# Patient Record
Sex: Female | Born: 1975 | Race: White | Hispanic: No | Marital: Married | Smoking: Never smoker
Health system: Southern US, Community
[De-identification: ages and names within clinical notes are randomized; demographics above are authoritative.]

## PROBLEM LIST (undated history)

## (undated) DIAGNOSIS — S98139A Complete traumatic amputation of one unspecified lesser toe, initial encounter: Secondary | ICD-10-CM

## (undated) HISTORY — PX: BUNIONECTOMY: SHX129

## (undated) HISTORY — DX: Complete traumatic amputation of one unspecified lesser toe, initial encounter: S98.139A

## (undated) HISTORY — PX: KNEE ARTHROSCOPY W/ ACL RECONSTRUCTION: SHX1858

---

## 2000-03-09 ENCOUNTER — Other Ambulatory Visit: Admission: RE | Admit: 2000-03-09 | Discharge: 2000-03-09 | Payer: Self-pay | Admitting: Obstetrics and Gynecology

## 2001-03-20 ENCOUNTER — Other Ambulatory Visit: Admission: RE | Admit: 2001-03-20 | Discharge: 2001-03-20 | Payer: Self-pay | Admitting: Obstetrics and Gynecology

## 2002-04-24 ENCOUNTER — Other Ambulatory Visit: Admission: RE | Admit: 2002-04-24 | Discharge: 2002-04-24 | Payer: Self-pay | Admitting: Obstetrics and Gynecology

## 2003-03-28 ENCOUNTER — Other Ambulatory Visit: Admission: RE | Admit: 2003-03-28 | Discharge: 2003-03-28 | Payer: Self-pay | Admitting: Obstetrics and Gynecology

## 2004-05-05 ENCOUNTER — Other Ambulatory Visit: Admission: RE | Admit: 2004-05-05 | Discharge: 2004-05-05 | Payer: Self-pay | Admitting: Obstetrics and Gynecology

## 2005-03-11 ENCOUNTER — Ambulatory Visit (HOSPITAL_COMMUNITY): Admission: RE | Admit: 2005-03-11 | Discharge: 2005-03-11 | Payer: Self-pay | Admitting: Obstetrics and Gynecology

## 2005-03-26 ENCOUNTER — Inpatient Hospital Stay (HOSPITAL_COMMUNITY): Admission: AD | Admit: 2005-03-26 | Discharge: 2005-03-28 | Payer: Self-pay | Admitting: Obstetrics and Gynecology

## 2005-05-07 ENCOUNTER — Other Ambulatory Visit: Admission: RE | Admit: 2005-05-07 | Discharge: 2005-05-07 | Payer: Self-pay | Admitting: Obstetrics and Gynecology

## 2006-04-15 IMAGING — US US OB COMP +14 WK
1 series · 13 of 28 positions shown · non-contrast
Comparison: none

CLINICAL DATA: Size less than dates.  Assess estimated fetal weight and amniotic fluid volume.

[Series 1: us ob comp +14 wk · 0.31mm/px · 13 of 41 slices shown]
[im 2/41]
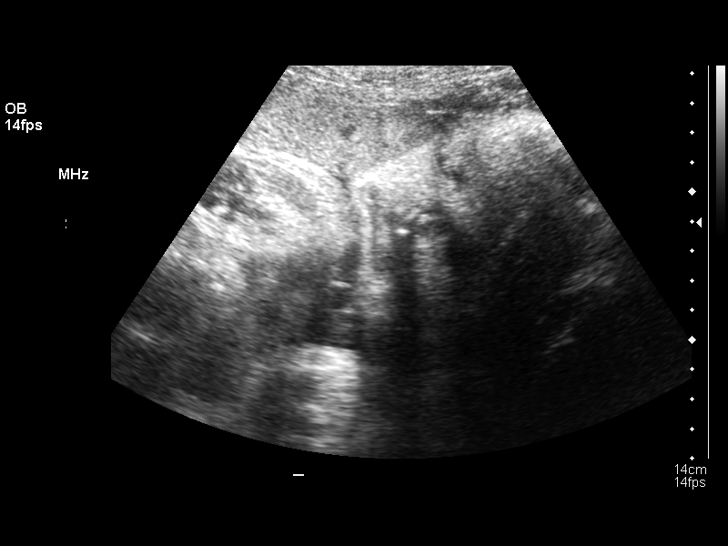
[im 5/41]
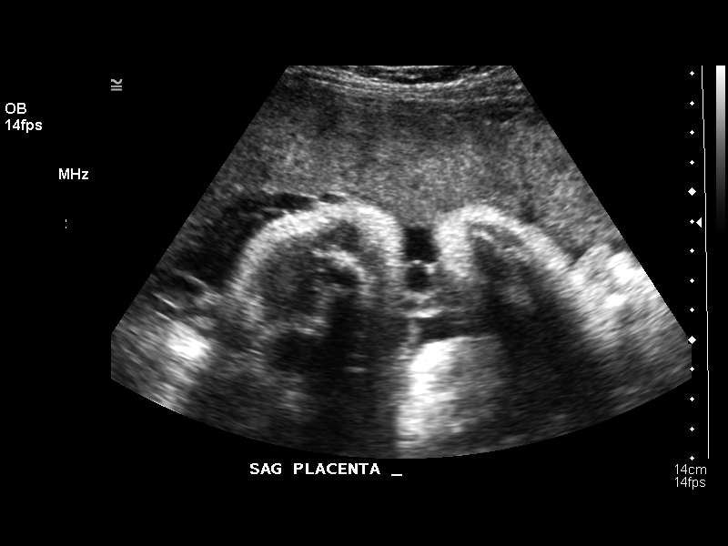
[im 8/41]
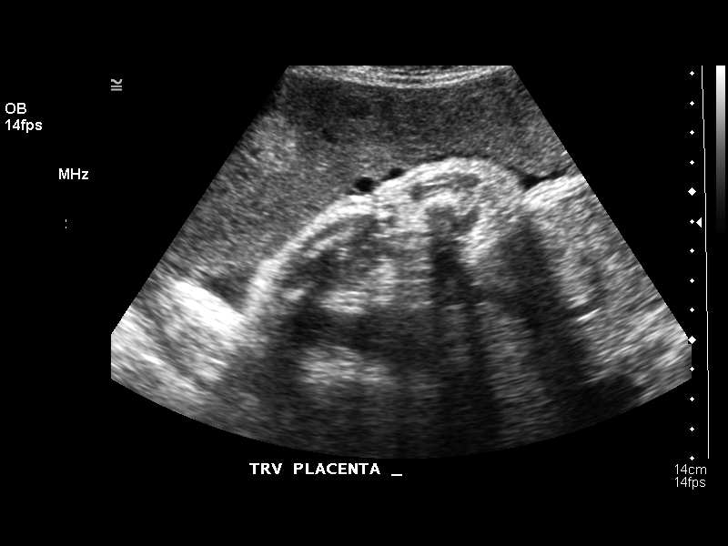
[im 11/41]
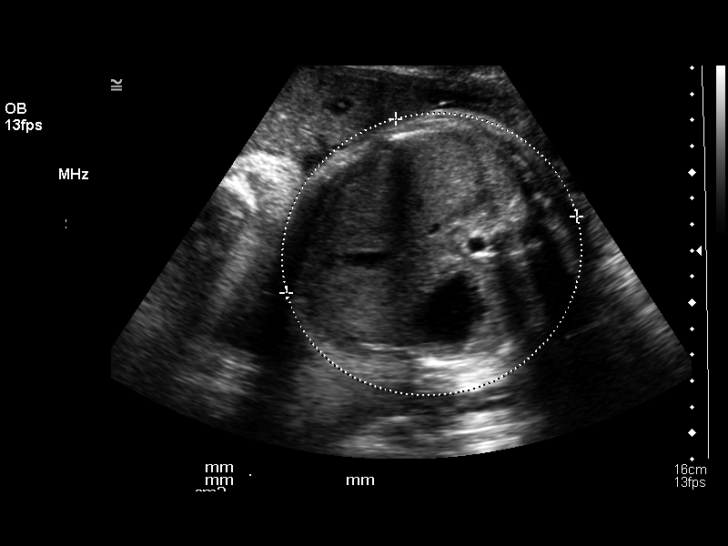
[im 14/41]
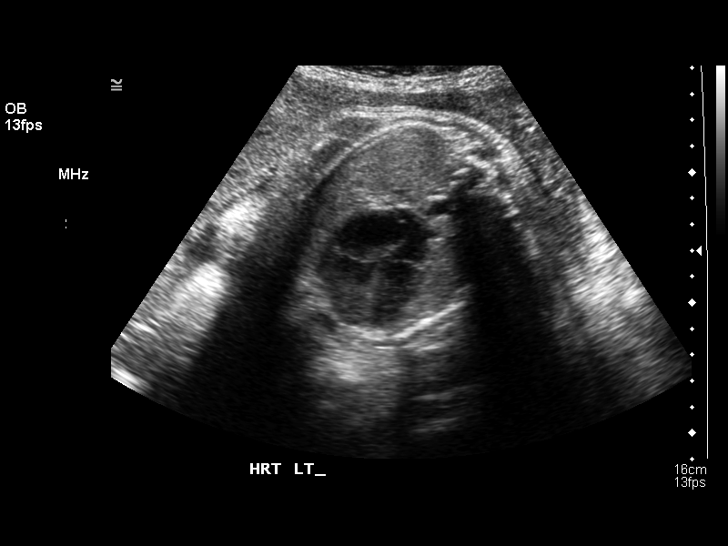
[im 17/41]
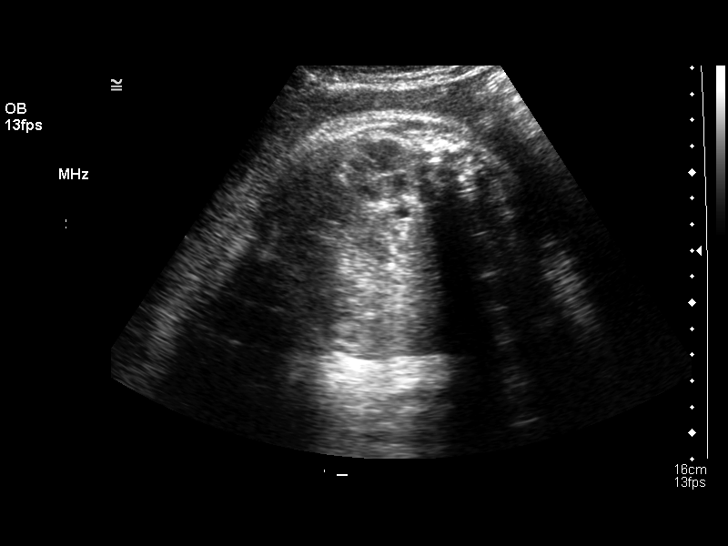
[im 21/41]
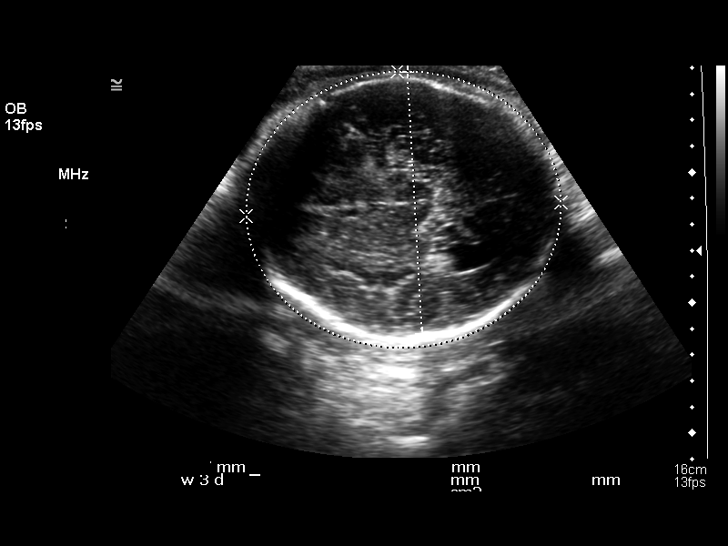
[im 24/41]
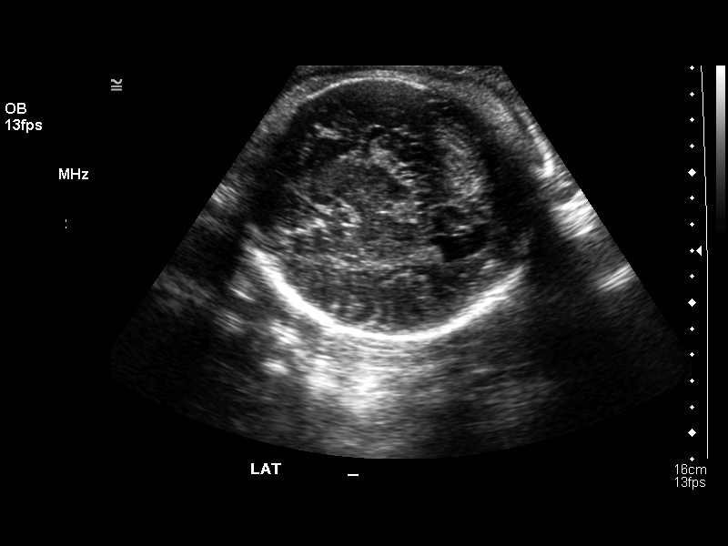
[im 27/41]
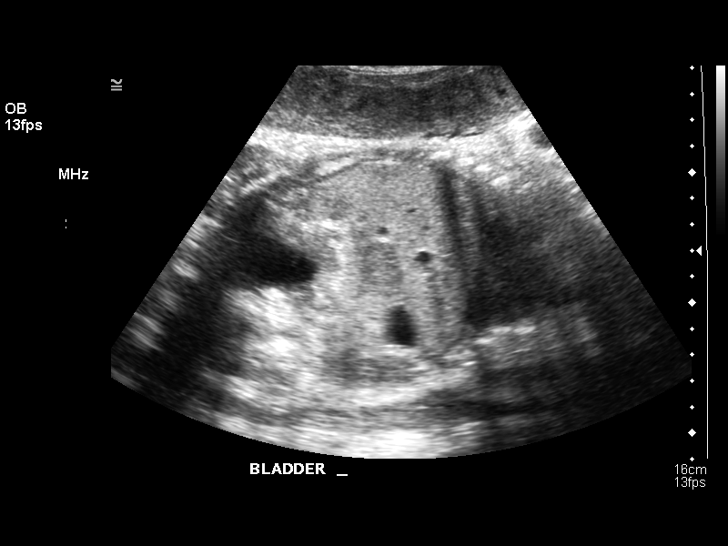
[im 30/41]
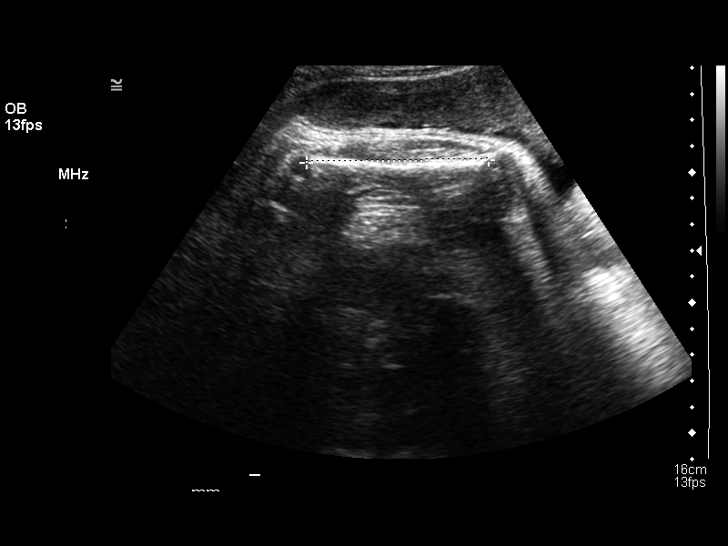
[im 33/41]
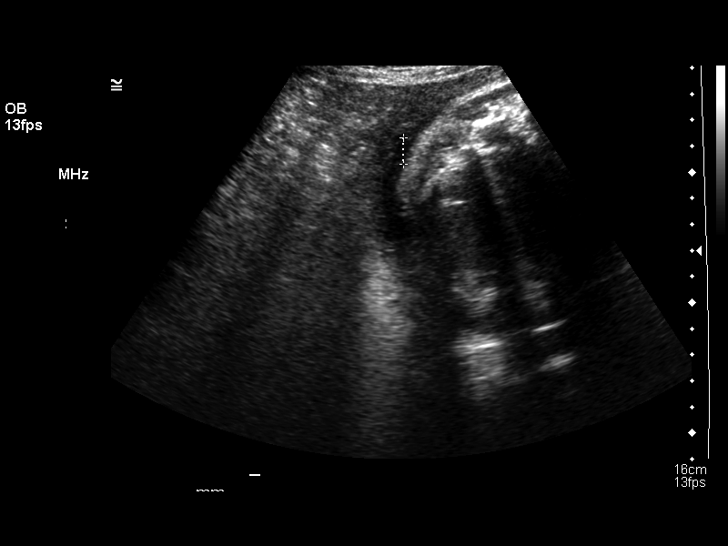
[im 36/41]
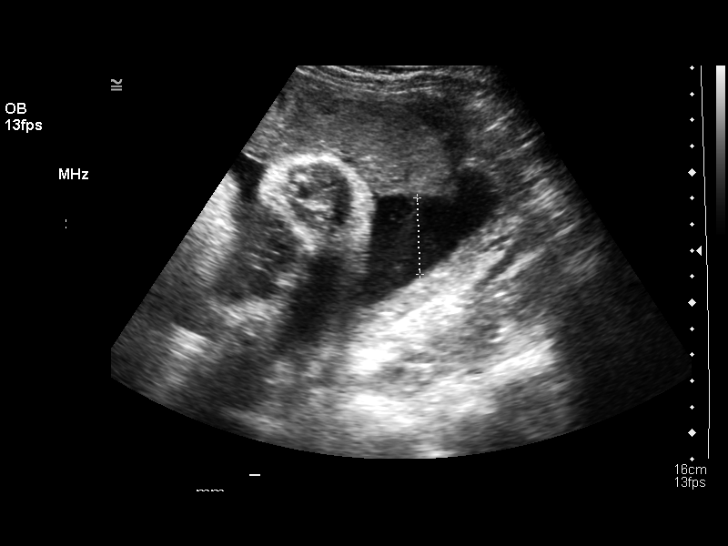
[im 39/41]
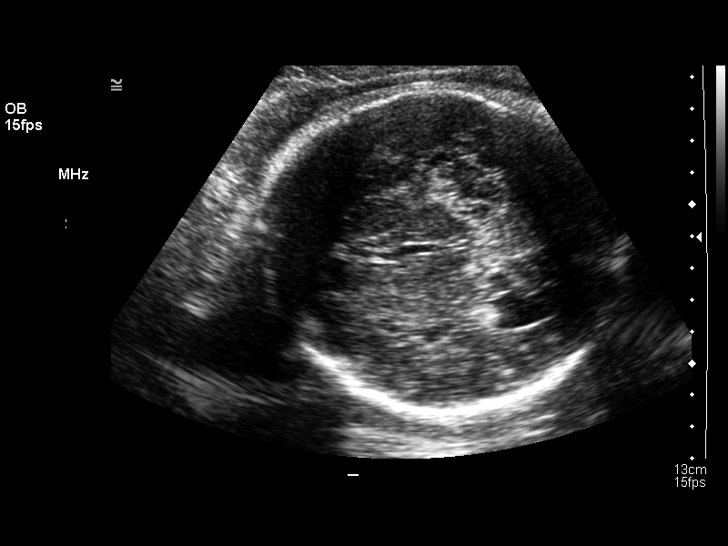

[13 of 28 positions shown; findings below may reference images not displayed]

OBSTETRICAL ULTRASOUND:
Number of Fetuses:  1
Heart Rate:  150
Movement:  Yes
Breathing:  Yes  
Presentation:  Cephalic
Placental Location:  Anterior
Grade:  I
Previa:  No
Amniotic Fluid (Subjective):  Normal
Amniotic Fluid (Objective):   10.7 cm AFI (5th -95th%ile = 7.3 ? 23.9 cm for 38 wks)

FETAL BIOMETRY
BPD:  10.2 cm   41 w 6 d
HC:  36.3 cm   Off the chart
AC:  34.5 cm   38 w 3 d
FL:    7.3 cm   37 w 3 d

MEAN GA:  39 w 3 d
1st US GA:  38 w 1 d
BPD/OFD: .85 (0.70 ? 0.86); FL/BPD: .72 (0.71 ? 0.87); FL/AC: .21 (0.20 ? 0.24); HC/AC: 1.05 (0.92 ? 1.05)
EFW:  4847 + / - 363 g (H) 75th ? 90th%ile (4715 to 9559 g) For 38 wks

FETAL ANATOMY
Lateral Ventricles:    Visualized 
Thalami/CSP:      Visualized 
Posterior Fossa:  Not visualized 
Nuchal Region:    N/A
Spine:      Not visualized 
4 Chamber Heart on Left:      Visualized 
Stomach on Left:      Visualized 
3 Vessel Cord:    Visualized 
Cord Insertion site:    Not visualized 
Kidneys:  Visualized 
Bladder:  Visualized 
Extremities:      Not visualized 

Comments:  Noted is the presence of borderline left lateral ventriculomegaly with the ventricle measuring 10.5 mm in AP width.  

MATERNAL UTERINE AND ADNEXAL FINDINGS
Cervix:   Not evaluated
IMPRESSION: 1.  Single intrauterine pregnancy demonstrating an estimated gestational age by ultrasound of 39 weeks and 2 days.  This is 1 week and 1 day ahead of assigned gestational age by initial ultrasound of 38 weeks and 1 day.  The estimated fetal weight is between the 75th and 90th percentile for a 38 week gestation and this is influenced by the large head measurement with the BPD correlating with a 41 week 6 day gestation and the head circumference off of the scale.  
2.  Subjectively and quantitatively normal amniotic fluid volume.
3.  A limited evaluation of fetal anatomic structures revealed borderline left lateral ventricular enlargement with an AP width of the lateral ventricle measuring 10.5 mm.  The right lateral ventricle could not be assessed due to positioning.  The thalami and cavum septum pellucidum have a normal appearance and no obvious intracranial abnormalities otherwise were noted.  A four chamber heart view, stomach, kidneys and bladder all have a normal appearance.  The remaining fetal anatomy was not evaluated.  Prior anatomic evaluations have been performed in the office.

## 2007-05-23 ENCOUNTER — Ambulatory Visit (HOSPITAL_COMMUNITY): Admission: RE | Admit: 2007-05-23 | Discharge: 2007-05-23 | Payer: Self-pay | Admitting: Internal Medicine

## 2007-06-27 ENCOUNTER — Inpatient Hospital Stay (HOSPITAL_COMMUNITY): Admission: RE | Admit: 2007-06-27 | Discharge: 2007-06-29 | Payer: Self-pay | Admitting: Obstetrics and Gynecology

## 2010-06-01 ENCOUNTER — Inpatient Hospital Stay (HOSPITAL_COMMUNITY): Admission: RE | Admit: 2010-06-01 | Discharge: 2010-06-03 | Payer: Self-pay | Admitting: Obstetrics and Gynecology

## 2010-10-04 ENCOUNTER — Encounter: Payer: Self-pay | Admitting: Obstetrics and Gynecology

## 2010-11-26 LAB — CBC
Hemoglobin: 12.9 g/dL (ref 12.0–15.0)
MCH: 32.5 pg (ref 26.0–34.0)
MCV: 92.9 fL (ref 78.0–100.0)
MCV: 93.5 fL (ref 78.0–100.0)
Platelets: 174 10*3/uL (ref 150–400)
Platelets: 212 10*3/uL (ref 150–400)
WBC: 9 10*3/uL (ref 4.0–10.5)

## 2010-11-26 LAB — RPR: RPR Ser Ql: NONREACTIVE

## 2011-01-29 NOTE — Discharge Summary (Signed)
[name], [name]                   ACCOUNT NO.:  [id number]   MEDICAL RECORD NO.:  [id number]          PATIENT TYPE:  INP   LOCATION:  9121                          FACILITY:  [facility]   PHYSICIAN:  [name], M.D. DATE OF BIRTH:  11/19/1975   DATE OF ADMISSION:  06/27/2007  DATE OF DISCHARGE:  06/29/2007                               DISCHARGE SUMMARY   DISCHARGE DIAGNOSES:  1. Term term pregnancy at 40 plus weeks delivered.  2. Status post normal spontaneous vaginal delivery.   DISCHARGE MEDICATIONS:  Motrin 600 mg p.o. every 6 hours.   DISCHARGE FOLLOWUP:  The patient's follow-up in 6 weeks for routine  postpartum exam.   HOSPITAL COURSE:  The patient is 35 year old G3, P 1-0-1-1 who was  admitted at [redacted] weeks gestation for induction of labor given post due  dates and favorable cervix.  Prenatal care was complicated by fundal  lag, but ultrasound showed an estimated fetal weight of 24th percentile  with normal fluid.  She had been followed with NSTs two times weekly,  and there were no other issues noted.  Prenatal labs were as follows, O+  antibody negative, rubella immune, hepatitis B surface antigen negative,  RPR negative, HIV negative, GC negative, chlamydia negative, group B  strep negative, 1-hour Glucola 106.   PAST OBSTETRICAL HISTORY:  In July 2006, she had a vaginal delivery of a  7 pounds 11 ounces infant.  She had one spontaneous miscarriage in 2007.   PAST GYNECOLOGICAL HISTORY:  No abnormal Pap smears.   PAST MEDICAL HISTORY:  1. Childhood asthma.  2. Irritable bowel syndrome which was stable.   PAST SURGICAL HISTORY:  1. She did lose three toes on her right foot as resolved of a      lawnmower accident as child.  2. She had a bunionectomy in 1996.  3. ACL repair and 1998.   MEDICATIONS:  Prenatal vitamins only.   ALLERGIES:  TYLOX, PENICILLIN, VICODIN.   PHYSICAL EXAMINATION:  VITAL SIGNS:  On admission she was afebrile and  stable vital signs  were noted.  Fetal heart rate was reactive.  Cervix  was 52, 3 and -2 station.  She had rupture of membranes performed with  clear fluid noted.  She continued to progress on Pitocin and reached  complete dilation after receiving an epidural.  She pushed well with a  normal spontaneous vaginal delivery of a vigorous female infant over a  second-degree laceration.  Apgars were 9 and 9, weight was 6 pounds 8  ounces.  Placenta delivered spontaneously.  Cervix and rectum were  intact.  She did have a small second-degree laceration which was  repaired with 2-0 Vicryl.  Postpartum course was unremarkable.  She had  a discharge hemoglobin of 10.9 and tolerated her pain well with Motrin  and was felt stable for discharge on postpartum day #2.      [name], M.D.  Electronically Signed     KR/MEDQ  D:  07/26/2007  T:  07/27/2007  Job:  [id number]

## 2011-01-29 NOTE — Discharge Summary (Signed)
NAMEINGRI, [name]                   ACCOUNT NO.:  [id number]   MEDICAL RECORD NO.:  [id number]          PATIENT TYPE:  INP   LOCATION:  9115                          FACILITY:  [facility]   PHYSICIAN:  Huel Cote, M.D. DATE OF BIRTH:  1976/02/12   DATE OF ADMISSION:  03/26/2005  DATE OF DISCHARGE:                                 DISCHARGE SUMMARY   DISCHARGE DIAGNOSES:  1.  Term pregnancy at 40+ weeks, delivered.  2.  Status post normal spontaneous vaginal delivery.   DISCHARGE MEDICATIONS:  1.  Motrin 600 mg p.o. q.6h.  2.  The patient was given a prescription for Percocet one to two tablets      p.o. every 4 hours as needed which she may or may not fill.   DISCHARGE FOLLOW-UP:  The patient is to follow up in 6 weeks for her routine  postpartum exam.   HOSPITAL COURSE:  The patient is a 35 year old G1 P0 who was at 24 and two-  sevenths weeks admitted for induction of labor given post due date and a  favorable cervix. Prenatal care was unremarkable. She had had a slight  fundal height lag; however, ultrasound confirmed normal EFW and AFI and  there was a family history of Werdnig-Hoffmann disease but the patient  declined any genetic workup and had no other complications. Prenatal labs  were as follows:  O positive, antibody negative, RPR nonreactive, rubella  immune, hepatitis B surface antigen negative, HIV negative, GC negative,  chlamydia negative, GBS negative, triple screen normal, 1-hour Glucola 94.  She had no past obstetrical history. No gynecological history. Her past  medical history significant for irritable bowel syndrome. Past surgical  history:  She did have a right foot injury as child where she lost three  toes with a lawnmower incident. She had a left bunionectomy and an ACL  repair in 1998. She had no allergies, no medications. On admission she was  afebrile with stable vital signs. Fetal heart rate was reactive. She was 2,  70, and a -1 and was placed on  Pitocin. She progressed quickly and within 4  hours received epidural and reached complete dilation, pushed well, had a  normal spontaneous vaginal delivery of a vigorous female infant over a second-  degree laceration. Apgars were 9 and 9, weight was 7 pounds 11 ounces. The  placenta delivered spontaneously. She had a second-degree laceration which  was repaired with 2-0 Vicryl and the sphincter was reinforced. Estimated  blood loss was 350. She was then admitted for routine postpartum care and on  postpartum day #2 is doing very well. Her pain was well controlled. She was  voiding and having bowel movements without difficulty, and was felt stable  for discharge home.       KR/MEDQ  D:  03/28/2005  T:  03/28/2005  Job:  [id number]

## 2011-06-23 LAB — CBC
HCT: 31 — ABNORMAL LOW
MCV: 92.1

## 2011-06-24 LAB — CBC
HCT: 32.3 — ABNORMAL LOW
Hemoglobin: 11.6 — ABNORMAL LOW
MCHC: 36
MCV: 89.8
Platelets: 234
RBC: 3.59 — ABNORMAL LOW

## 2013-08-30 ENCOUNTER — Encounter (INDEPENDENT_AMBULATORY_CARE_PROVIDER_SITE_OTHER): Payer: Self-pay

## 2013-08-30 ENCOUNTER — Ambulatory Visit (INDEPENDENT_AMBULATORY_CARE_PROVIDER_SITE_OTHER): Payer: BC Managed Care – PPO | Admitting: Family Medicine

## 2013-08-30 ENCOUNTER — Encounter: Payer: Self-pay | Admitting: Family Medicine

## 2013-08-30 VITALS — BP 129/88 | HR 73 | Ht 62.0 in | Wt 128.0 lb

## 2013-08-30 DIAGNOSIS — M25461 Effusion, right knee: Secondary | ICD-10-CM

## 2013-08-30 DIAGNOSIS — M25469 Effusion, unspecified knee: Secondary | ICD-10-CM

## 2013-08-30 NOTE — Patient Instructions (Signed)
You have a knee effusion. Without pain, redness I wouldn't recommend aspirating and/or injecting your knee at this time. Rheumatoid arthritis, gout are in the differential but unlikely without pain or other joints involved. Your knee structurally is sound otherwise. 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 Straight leg raises, leg raises with foot turned outwards, hip side raises 3 sets of 10 once a day for next 6 weeks. Wear knee brace when running, exercising. Activities as tolerated without restrictions. Consider icing, elevation as needed for swelling. If you continue to have problems and/or pain comes on we could consider aspirating and sending the fluid to rule out those other conditions. Follow up with me as needed.

## 2013-09-02 ENCOUNTER — Encounter: Payer: Self-pay | Admitting: Family Medicine

## 2013-09-02 NOTE — Progress Notes (Signed)
Patient ID: [name], female   DOB: [birth year], 37 y.o.   MRN: [id number]  PCP: No primary provider on file.  Subjective:   HPI: Patient is a 37 y.o. female here for right knee swelling.  Patient denies known injury. She runs about 3-4 days a week, total 15 miles a week. States she had swelling of her knee for about the past week with feeling of instability. She had similar issues after having each of her kids but it went away on its own. No other joints involved. No personal history of gout. No family history of RA. Knees make a crunching sound when doing squats. No locking or catching.  History reviewed. No pertinent past medical history.  No current outpatient prescriptions on file prior to visit.   No current facility-administered medications on file prior to visit.    Past Surgical History  Procedure Laterality Date  . Knee arthroscopy w/ acl reconstruction Left     Allergies  Allergen Reactions  . Hydrocodone   . Penicillins   . Tylox [Oxycodone-Acetaminophen]     History   Social History  . Marital Status: Married    Spouse Name: N/A    Number of Children: N/A  . Years of Education: N/A   Occupational History  . Not on file.   Social History Main Topics  . Smoking status: Never Smoker   . Smokeless tobacco: Not on file  . Alcohol Use: Not on file  . Drug Use: Not on file  . Sexual Activity: Not on file   Other Topics Concern  . Not on file   Social History Narrative  . No narrative on file    Family History  Problem Relation Age of Onset  . Diabetes Mother   . Diabetes Father   . Heart attack Father   . Hyperlipidemia Father   . Hypertension Father   . Sudden death Father     BP 129/88  Pulse 73  Ht 5\' 2"  (1.575 m)  Wt 128 lb (58.06 kg)  BMI 23.41 kg/m2  Review of Systems: See HPI above.    Objective:  Physical Exam:  Gen: NAD  Right knee: Mild effusion.  No gross deformity, ecchymoses. No TTP. FROM. Negative ant/post  drawers. Negative valgus/varus testing. Negative lachmanns. Negative mcmurrays, apleys, patellar apprehension. NV intact distally.    Assessment & Plan:  1. Right knee effusion - small effusion currently and otherwise asymptomatic.  Has been told in past was related to patellar tracking issues.  No personal h/o gout, no FH rheumatoid arthritis.  Doubt either of these given patient's asymptomatic status.  Discussed possibility of aspirating and analyzing fluid but with her being asymptomatic I would wait on this unless she gets other joint involvement, more symptoms.  Discussed basic patellofemoral exercises, measures.  Icing, elevation, compression as needed.

## 2013-09-04 DIAGNOSIS — M25461 Effusion, right knee: Secondary | ICD-10-CM | POA: Insufficient documentation

## 2013-09-04 NOTE — Assessment & Plan Note (Signed)
small effusion currently and otherwise asymptomatic.  Has been told in past was related to patellar tracking issues.  No personal h/o gout, no FH rheumatoid arthritis.  Doubt either of these given patient's asymptomatic status.  Discussed possibility of aspirating and analyzing fluid but with her being asymptomatic I would wait on this unless she gets other joint involvement, more symptoms.  Discussed basic patellofemoral exercises, measures.  Icing, elevation, compression as needed.

## 2013-09-27 ENCOUNTER — Ambulatory Visit (INDEPENDENT_AMBULATORY_CARE_PROVIDER_SITE_OTHER): Payer: BC Managed Care – PPO | Admitting: Family Medicine

## 2013-09-27 ENCOUNTER — Encounter: Payer: Self-pay | Admitting: Family Medicine

## 2013-09-27 VITALS — BP 134/83 | HR 63 | Resp 16 | Ht 62.5 in | Wt 132.0 lb

## 2013-09-27 DIAGNOSIS — R0602 Shortness of breath: Secondary | ICD-10-CM

## 2013-09-27 DIAGNOSIS — R5383 Other fatigue: Secondary | ICD-10-CM

## 2013-09-27 DIAGNOSIS — R5381 Other malaise: Secondary | ICD-10-CM

## 2013-09-27 DIAGNOSIS — Z13220 Encounter for screening for lipoid disorders: Secondary | ICD-10-CM

## 2013-09-27 NOTE — Patient Instructions (Signed)
1)  BP - Check no more than 3 x per week  DASH Diet The DASH diet stands for "Dietary Approaches to Stop Hypertension." It is a healthy eating plan that has been shown to reduce high blood pressure (hypertension) in as little as 14 days, while also possibly providing other significant health benefits. These other health benefits include reducing the risk of breast cancer after menopause and reducing the risk of type 2 diabetes, heart disease, colon cancer, and stroke. Health benefits also include weight loss and slowing kidney failure in patients with chronic kidney disease.  DIET GUIDELINES  Limit salt (sodium). Your diet should contain less than 1500 mg of sodium daily.  Limit refined or processed carbohydrates. Your diet should include mostly whole grains. Desserts and added sugars should be used sparingly.  Include small amounts of heart-healthy fats. These types of fats include nuts, oils, and tub margarine. Limit saturated and trans fats. These fats have been shown to be harmful in the body. CHOOSING FOODS  The following food groups are based on a 2000 calorie diet. See your Registered Dietitian for individual calorie needs. Grains and Grain Products (6 to 8 servings daily)  Eat More Often: Whole-wheat bread, brown rice, whole-grain or wheat pasta, quinoa, popcorn without added fat or salt (air popped).  Eat Less Often: White bread, white pasta, white rice, cornbread. Vegetables (4 to 5 servings daily)  Eat More Often: Fresh, frozen, and canned vegetables. Vegetables may be raw, steamed, roasted, or grilled with a minimal amount of fat.  Eat Less Often/Avoid: Creamed or fried vegetables. Vegetables in a cheese sauce. Fruit (4 to 5 servings daily)  Eat More Often: All fresh, canned (in natural juice), or frozen fruits. Dried fruits without added sugar. One hundred percent fruit juice ( cup [237 mL] daily).  Eat Less Often: Dried fruits with added sugar. Canned fruit in light or heavy  syrup. Foot LockerLean Meats, Fish, and Poultry (2 servings or less daily. One serving is 3 to 4 oz [85-114 g]).  Eat More Often: Ninety percent or leaner ground beef, tenderloin, sirloin. Round cuts of beef, chicken breast, Malaw[place]urkey breast. All fish. Grill, bake, or broil your meat. Nothing should be fried.  Eat Less Often/Avoid: Fatty cuts of meat, Malaw[place]urkey, or chicken leg, thigh, or wing. Fried cuts of meat or fish. Dairy (2 to 3 servings)  Eat More Often: Low-fat or fat-free milk, low-fat plain or light yogurt, reduced-fat or part-skim cheese.  Eat Less Often/Avoid: Milk (whole, 2%).Whole milk yogurt. Full-fat cheeses. Nuts, Seeds, and Legumes (4 to 5 servings per week)  Eat More Often: All without added salt.  Eat Less Often/Avoid: Salted nuts and seeds, canned beans with added salt. Fats and Sweets (limited)  Eat More Often: Vegetable oils, tub margarines without trans fats, sugar-free gelatin. Mayonnaise and salad dressings.  Eat Less Often/Avoid: Coconut oils, palm oils, butter, stick margarine, cream, half and half, cookies, candy, pie. FOR MORE INFORMATION The Dash Diet Eating Plan: www.dashdiet.org Document Released: 08/19/2011 Document Revised: 11/22/2011 Document Reviewed: 08/19/2011 [facility] Patient Information 2014 [employer], [place].

## 2013-09-27 NOTE — Progress Notes (Signed)
Subjective:    Patient ID: [name], female    DOB: 11/21/1975, 38 y.o.   MRN: [id number]  HPI  [name] is here today to establish care with our practice.  She was referred to us by Dr. Lazaro ArmsHudnall's staff.  She does not currently have a PCP since she rarely gets sick.  She [facility]. She feels that she is in very good health.  Her only concern at today's visit is that she has recently been having some episodes of shortness of breath that she says lasts a couple of hours.  She feels that she has to work harder to expand her chest to get in a good breathe.     Review of Systems  Constitutional: Negative for activity change, fatigue and unexpected weight change.  HENT: Negative.   Eyes: Negative.   Respiratory: Positive for shortness of breath.   Cardiovascular: Negative for chest pain, palpitations and leg swelling.  Gastrointestinal: Negative for diarrhea and constipation.  Endocrine: Negative.   Genitourinary: Negative for difficulty urinating.  Musculoskeletal: Negative.   Skin: Negative.   Neurological: Negative.   Hematological: Negative for adenopathy. Does not bruise/bleed easily.  Psychiatric/Behavioral: Negative for sleep disturbance and dysphoric mood. The patient is not nervous/anxious.      Past Medical History  Diagnosis Date  . 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 Knee arthroscopy w/ acl reconstruction Left   . Bunionectomy       History   Social History Narrative   Marital Status:  Married Multimedia programmer(Eric)    Children: Son Alycia Rossetti(Ryan) Daughters ([place], [place])    Pets: Dogs (2)    Living Situation: Lives with husband and children.   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 Diabetes Mother     Type I   . Kidney disease Mother   . Fibrocystic breast disease Mother   . Diabetes Father     Type II  . Heart attack Father   . Hyperlipidemia Father   . Hypertension Father   . Sudden death Father   . Alcohol abuse Father   . Arthritis Father   . Stroke Father   . Cancer Paternal Grandmother     Breast, Pancreatic   . Diabetes Paternal Grandmother   . Heart disease Paternal Grandfather     Brothers x 4 all died from MI at an early age.    . Sudden death Paternal Grandfather      Allergies  Allergen Reactions  . Hydrocodone Hives  . Penicillins Hives  . Tylox [Oxycodone-Acetaminophen] Hives      Objective:   Physical Exam  Vitals reviewed. Constitutional: She is oriented to person, place, and time.  Eyes: Conjunctivae are normal. No scleral icterus.  Neck: Neck supple. No thyromegaly present.  Cardiovascular: Normal rate, regular rhythm and normal  heart sounds.   Pulmonary/Chest: Effort normal and breath sounds normal.  Musculoskeletal: She exhibits no edema and no tenderness.  Lymphadenopathy:    She has no cervical adenopathy.  Neurological: She is alert and oriented to person, place, and time.  Skin: Skin is warm and dry.  Psychiatric: She has a normal mood and affect. Her behavior is normal. Judgment and thought content normal.      Assessment & Plan:    [name] was seen today for establish care.  Diagnoses and associated orders for this visit:  Shortness of breath Comments: Her breathing appears to be perfectly fine today and she is not experiencing this sensation that she has had over the past few weeks.  We'll send for a CXR if these symptoms continue.    Other malaise and fatigue - CBC w/Diff - COMPLETE METABOLIC  PANEL WITH GFR - TSH  Need for lipid screening - Lipid panel  TIME SPENT "FACE TO FACE" WITH PATIENT -  30 MINS

## 2013-10-05 LAB — CBC WITH DIFFERENTIAL/PLATELET
Basophils Absolute: 0 10*3/uL (ref 0.0–0.1)
Basophils Relative: 0 % (ref 0–1)
Eosinophils Absolute: 0.1 10*3/uL (ref 0.0–0.7)
Eosinophils Relative: 2 % (ref 0–5)
HCT: 38.8 % (ref 36.0–46.0)
Hemoglobin: 13.5 g/dL (ref 12.0–15.0)
Lymphocytes Relative: 38 % (ref 12–46)
Lymphs Abs: 1.4 10*3/uL (ref 0.7–4.0)
MCH: 30.3 pg (ref 26.0–34.0)
MCHC: 34.8 g/dL (ref 30.0–36.0)
MCV: 87.2 fL (ref 78.0–100.0)
Monocytes Absolute: 0.2 10*3/uL (ref 0.1–1.0)
Monocytes Relative: 7 % (ref 3–12)
Neutro Abs: 1.9 10*3/uL (ref 1.7–7.7)
Neutrophils Relative %: 53 % (ref 43–77)
Platelets: 279 10*3/uL (ref 150–400)
RBC: 4.45 MIL/uL (ref 3.87–5.11)
RDW: 13.4 % (ref 11.5–15.5)
WBC: 3.6 10*3/uL — ABNORMAL LOW (ref 4.0–10.5)

## 2013-10-05 LAB — COMPLETE METABOLIC PANEL WITH GFR
ALT: 8 U/L (ref 0–35)
AST: 15 U/L (ref 0–37)
Albumin: 4.3 g/dL (ref 3.5–5.2)
Alkaline Phosphatase: 43 U/L (ref 39–117)
BUN: 13 mg/dL (ref 6–23)
CO2: 29 mEq/L (ref 19–32)
Calcium: 9.1 mg/dL (ref 8.4–10.5)
Chloride: 103 mEq/L (ref 96–112)
Creat: 0.81 mg/dL (ref 0.50–1.10)
GFR, Est African American: >89 mL/min
GFR, Est Non African American: >89 mL/min
Glucose, Bld: 90 mg/dL (ref 70–99)
Potassium: 3.9 mEq/L (ref 3.5–5.3)
Sodium: 139 mEq/L (ref 135–145)
Total Bilirubin: 0.4 mg/dL (ref 0.3–1.2)
Total Protein: 7 g/dL (ref 6.0–8.3)

## 2013-10-05 LAB — LIPID PANEL
Cholesterol: 157 mg/dL (ref 0–200)
HDL: 63 mg/dL (ref >39)
LDL Cholesterol: 83 mg/dL (ref 0–99)
Total CHOL/HDL Ratio: 2.5 Ratio
Triglycerides: 53 mg/dL (ref <150)
VLDL: 11 mg/dL (ref 0–40)

## 2013-10-06 LAB — TSH: TSH: 1.96 u[IU]/mL (ref 0.350–4.500)

## 2013-10-15 ENCOUNTER — Encounter: Payer: Self-pay | Admitting: *Deleted

## 2013-11-20 ENCOUNTER — Encounter: Payer: Self-pay | Admitting: Family Medicine

## 2013-11-20 ENCOUNTER — Other Ambulatory Visit (HOSPITAL_COMMUNITY)
Admission: RE | Admit: 2013-11-20 | Discharge: 2013-11-20 | Disposition: A | Discharge status: Home / Self Care | Payer: BC Managed Care – PPO | Source: Ambulatory Visit | Attending: Family Medicine | Admitting: Family Medicine

## 2013-11-20 ENCOUNTER — Ambulatory Visit (INDEPENDENT_AMBULATORY_CARE_PROVIDER_SITE_OTHER): Payer: BC Managed Care – PPO | Admitting: Family Medicine

## 2013-11-20 ENCOUNTER — Encounter (INDEPENDENT_AMBULATORY_CARE_PROVIDER_SITE_OTHER): Payer: Self-pay

## 2013-11-20 VITALS — BP 102/71 | HR 85 | Ht 62.5 in | Wt 133.0 lb

## 2013-11-20 DIAGNOSIS — Z0000 Encounter for general adult medical examination without abnormal findings: Secondary | ICD-10-CM

## 2013-11-20 DIAGNOSIS — R5383 Other fatigue: Secondary | ICD-10-CM

## 2013-11-20 DIAGNOSIS — Z13220 Encounter for screening for lipoid disorders: Secondary | ICD-10-CM | POA: Insufficient documentation

## 2013-11-20 DIAGNOSIS — R5381 Other malaise: Secondary | ICD-10-CM | POA: Insufficient documentation

## 2013-11-20 DIAGNOSIS — D72819 Decreased white blood cell count, unspecified: Secondary | ICD-10-CM

## 2013-11-20 DIAGNOSIS — Z01419 Encounter for gynecological examination (general) (routine) without abnormal findings: Secondary | ICD-10-CM | POA: Insufficient documentation

## 2013-11-20 DIAGNOSIS — Z124 Encounter for screening for malignant neoplasm of cervix: Secondary | ICD-10-CM

## 2013-11-20 DIAGNOSIS — R0602 Shortness of breath: Secondary | ICD-10-CM | POA: Insufficient documentation

## 2013-11-20 DIAGNOSIS — Z1151 Encounter for screening for human papillomavirus (HPV): Secondary | ICD-10-CM | POA: Insufficient documentation

## 2013-11-20 LAB — POCT URINALYSIS DIPSTICK
Bilirubin, UA: NEGATIVE
Blood, UA: NEGATIVE
Glucose, UA: NEGATIVE
Ketones, UA: NEGATIVE
Leukocytes, UA: NEGATIVE
Nitrite, UA: NEGATIVE
Protein, UA: NEGATIVE
Spec Grav, UA: <=1.005
Urobilinogen, UA: NEGATIVE
pH, UA: 6

## 2013-11-20 NOTE — Progress Notes (Deleted)
   Subjective:    Patient ID: [name], female    DOB: 11/09/1975, 38 y.o.   MRN: [id number]  HPI  [name] is here today for her annual CPE/PAP.  Her LMP was on 10/24/2013.  She has continued monitoring her blood pressure but she feels that it continues fluctuating.    Review of Systems     Objective:   Physical Exam  Musculoskeletal:       Feet:          Assessment & Plan:

## 2013-11-20 NOTE — Patient Instructions (Signed)

## 2013-12-31 NOTE — Progress Notes (Signed)
Subjective:    Patient ID: Era BumpersMary S Proctor, female    DOB: 04/07/1976, 38 y.o.   MRN: [id number]  HPI  [name] is here today for her annual CPE/PAP.  Her LMP was on 10/24/2013.  She has been monitoring her blood pressure and has noticed a lot of fluctuations in her level.       Review of Systems  Constitutional: Negative for activity change, appetite change, fatigue and unexpected weight change.  HENT: Negative for congestion, dental problem, ear pain, hearing loss, trouble swallowing and voice change.   Eyes: Negative for pain, redness and visual disturbance.  Respiratory: Negative for cough and shortness of breath.   Cardiovascular: Negative for chest pain, palpitations and leg swelling.  Gastrointestinal: Negative for nausea, vomiting, abdominal pain, diarrhea, constipation and blood in stool.  Endocrine: Negative for cold intolerance, heat intolerance, polydipsia, polyphagia and polyuria.  Genitourinary: Negative for dysuria, urgency, frequency, hematuria, vaginal discharge and pelvic pain.  Musculoskeletal: Negative for arthralgias, back pain, joint swelling, myalgias and neck pain.  Skin: Negative for rash.  Neurological: Negative for dizziness, weakness and headaches.  Hematological: Negative for adenopathy. Does not bruise/bleed easily.  Psychiatric/Behavioral: Negative for sleep disturbance, dysphoric mood and decreased concentration. The patient is not nervous/anxious.     Past Medical History  Diagnosis Date  . 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 Knee arthroscopy w/ acl reconstruction Left   . Bunionectomy       History   Social History Narrative   Marital Status:  Married Multimedia programmer(Eric)    Children: Son Alycia Rossetti(Ryan) Daughters ([place], [place])    Pets: Dogs (2)    Living Situation: Lives with husband and children.   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 Diabetes Mother     Type I   . Kidney disease Mother   . Fibrocystic breast disease Mother   . Diabetes Father     Type II  . Heart attack Father   . Hyperlipidemia Father   . Hypertension Father   . Sudden death Father   . Alcohol abuse Father   . Arthritis Father   . Stroke Father   . Cancer Paternal Grandmother     Breast, Pancreatic   . Diabetes Paternal Grandmother   . Heart disease Paternal Grandfather     Brothers x 4 all died from MI at an early age.    . Sudden death Paternal Grandfather      Allergies  Allergen Reactions  . Hydrocodone Hives  . Penicillins Hives  . Tylox [Oxycodone-Acetaminophen] Hives       Objective:   Physical Exam  Constitutional: She is oriented to person, place, and time. She appears well-developed and well-nourished.  HENT:  Head: Normocephalic and atraumatic.  Right Ear: External ear normal.  Left Ear: External ear normal.  Nose: Nose normal.  Mouth/Throat: Oropharynx is clear  and moist.  Eyes: Conjunctivae are normal. Pupils are equal, round, and reactive to light. No scleral icterus.  Neck: Normal range of motion. Neck supple. No thyromegaly present.  Cardiovascular: Normal rate, regular rhythm, normal heart sounds and intact distal pulses.  Exam reveals no gallop and no friction rub.   No murmur heard. Pulmonary/Chest: Effort normal and breath sounds normal.  Abdominal: Soft. Bowel sounds are normal.  Genitourinary: Vagina normal and uterus normal.  Musculoskeletal: Normal range of motion. She exhibits no edema and no tenderness.       Feet:  Lymphadenopathy:    She has no cervical  adenopathy.  Neurological: She is alert and oriented to person, place, and time. She has normal reflexes.  Skin: Skin is warm and dry.  Psychiatric: She has a normal mood and affect. Her behavior is normal. Judgment and thought content normal.       Assessment & Plan:    [name] was seen today for annual exam.  Diagnoses and associated orders for this visit:  Routine general medical examination at a health care facility - POCT urinalysis dipstick  Screening for cervical cancer - Cytology - PAP  Leukopenia - CBC; Future

## 2014-01-26 DIAGNOSIS — Z124 Encounter for screening for malignant neoplasm of cervix: Secondary | ICD-10-CM | POA: Insufficient documentation

## 2014-01-26 DIAGNOSIS — D72819 Decreased white blood cell count, unspecified: Secondary | ICD-10-CM | POA: Insufficient documentation

## 2014-01-26 DIAGNOSIS — Z0000 Encounter for general adult medical examination without abnormal findings: Secondary | ICD-10-CM | POA: Insufficient documentation

## 2021-11-02 ENCOUNTER — Encounter (HOSPITAL_BASED_OUTPATIENT_CLINIC_OR_DEPARTMENT_OTHER): Payer: Self-pay | Admitting: *Deleted

## 2021-11-02 ENCOUNTER — Other Ambulatory Visit: Payer: Self-pay

## 2021-11-02 ENCOUNTER — Emergency Department (HOSPITAL_BASED_OUTPATIENT_CLINIC_OR_DEPARTMENT_OTHER): Payer: BC Managed Care – PPO

## 2021-11-02 ENCOUNTER — Emergency Department (HOSPITAL_BASED_OUTPATIENT_CLINIC_OR_DEPARTMENT_OTHER)
Admission: EM | Admit: 2021-11-02 | Discharge: 2021-11-02 | Disposition: A | Discharge status: Home / Self Care | Payer: BC Managed Care – PPO | Attending: Emergency Medicine | Admitting: Emergency Medicine

## 2021-11-02 DIAGNOSIS — N132 Hydronephrosis with renal and ureteral calculous obstruction: Secondary | ICD-10-CM | POA: Insufficient documentation

## 2021-11-02 DIAGNOSIS — R1030 Lower abdominal pain, unspecified: Secondary | ICD-10-CM | POA: Diagnosis present

## 2021-11-02 DIAGNOSIS — N200 Calculus of kidney: Secondary | ICD-10-CM

## 2021-11-02 LAB — URINALYSIS, ROUTINE W REFLEX MICROSCOPIC
Bilirubin Urine: NEGATIVE
Glucose, UA: NEGATIVE mg/dL
Ketones, ur: NEGATIVE mg/dL
Leukocytes,Ua: NEGATIVE
Nitrite: NEGATIVE
Protein, ur: NEGATIVE mg/dL
Specific Gravity, Urine: 1.025 (ref 1.005–1.030)
pH: 6.5 (ref 5.0–8.0)

## 2021-11-02 LAB — CBC WITH DIFFERENTIAL/PLATELET
Abs Immature Granulocytes: 0.02 10*3/uL (ref 0.00–0.07)
Basophils Absolute: 0 10*3/uL (ref 0.0–0.1)
Basophils Relative: 0 %
Eosinophils Absolute: 0.1 10*3/uL (ref 0.0–0.5)
Eosinophils Relative: 1 %
HCT: 39.6 % (ref 36.0–46.0)
Hemoglobin: 13.7 g/dL (ref 12.0–15.0)
Immature Granulocytes: 0 %
Lymphocytes Relative: 18 %
Lymphs Abs: 1.4 10*3/uL (ref 0.7–4.0)
MCH: 30.4 pg (ref 26.0–34.0)
MCHC: 34.6 g/dL (ref 30.0–36.0)
MCV: 88 fL (ref 80.0–100.0)
Monocytes Absolute: 0.3 10*3/uL (ref 0.1–1.0)
Monocytes Relative: 4 %
Neutro Abs: 6.1 10*3/uL (ref 1.7–7.7)
Neutrophils Relative %: 77 %
Platelets: 328 10*3/uL (ref 150–400)
RBC: 4.5 MIL/uL (ref 3.87–5.11)
RDW: 12.9 % (ref 11.5–15.5)
WBC: 8 10*3/uL (ref 4.0–10.5)
nRBC: 0 % (ref 0.0–0.2)

## 2021-11-02 LAB — COMPREHENSIVE METABOLIC PANEL
ALT: 17 U/L (ref 0–44)
AST: 24 U/L (ref 15–41)
Albumin: 4.3 g/dL (ref 3.5–5.0)
Alkaline Phosphatase: 58 U/L (ref 38–126)
Anion gap: 9 (ref 5–15)
BUN: 15 mg/dL (ref 6–20)
CO2: 24 mmol/L (ref 22–32)
Calcium: 9.1 mg/dL (ref 8.9–10.3)
Chloride: 102 mmol/L (ref 98–111)
Creatinine, Ser: 0.77 mg/dL (ref 0.44–1.00)
GFR, Estimated: >60 mL/min (ref >60)
Glucose, Bld: 144 mg/dL — ABNORMAL HIGH (ref 70–99)
Potassium: 3.6 mmol/L (ref 3.5–5.1)
Sodium: 135 mmol/L (ref 135–145)
Total Bilirubin: 0.5 mg/dL (ref 0.3–1.2)
Total Protein: 7.9 g/dL (ref 6.5–8.1)

## 2021-11-02 LAB — URINALYSIS, MICROSCOPIC (REFLEX): RBC / HPF: >50 RBC/hpf (ref 0–5)

## 2021-11-02 LAB — LIPASE, BLOOD: Lipase: 31 U/L (ref 11–51)

## 2021-11-02 LAB — PREGNANCY, URINE: Preg Test, Ur: NEGATIVE

## 2021-11-02 MED ORDER — TRAMADOL HCL 50 MG PO TABS: 50.0000 mg | ORAL_TABLET | Freq: Four times a day (QID) | ORAL | 0 refills | Status: DC | PRN

## 2021-11-02 MED ORDER — TAMSULOSIN HCL 0.4 MG PO CAPS: 0.4000 mg | ORAL_CAPSULE | Freq: Every day | ORAL | 0 refills | Status: DC

## 2021-11-02 MED ORDER — IBUPROFEN 800 MG PO TABS: 800.0000 mg | ORAL_TABLET | Freq: Three times a day (TID) | ORAL | 0 refills | Status: AC

## 2021-11-02 MED ORDER — ONDANSETRON HCL 4 MG/2ML IJ SOLN
4.0000 mg | Freq: Once | INTRAMUSCULAR | Status: AC
  Administered 2021-11-02: 4 mg via INTRAVENOUS
  Filled 2021-11-02: qty 2

## 2021-11-02 MED ORDER — TRAMADOL HCL 50 MG PO TABS: 50.0000 mg | ORAL_TABLET | Freq: Four times a day (QID) | ORAL | 0 refills | Status: AC | PRN

## 2021-11-02 MED ORDER — ONDANSETRON 8 MG PO TBDP: 8.0000 mg | ORAL_TABLET | Freq: Three times a day (TID) | ORAL | 0 refills | Status: DC | PRN

## 2021-11-02 MED ORDER — FENTANYL CITRATE PF 50 MCG/ML IJ SOSY
50.0000 ug | PREFILLED_SYRINGE | Freq: Once | INTRAMUSCULAR | Status: AC
  Administered 2021-11-02: 50 ug via INTRAVENOUS
  Filled 2021-11-02: qty 1

## 2021-11-02 MED ORDER — KETOROLAC TROMETHAMINE 30 MG/ML IJ SOLN
30.0000 mg | Freq: Once | INTRAMUSCULAR | Status: AC
  Administered 2021-11-02: 30 mg via INTRAVENOUS
  Filled 2021-11-02: qty 1

## 2021-11-02 MED ORDER — IBUPROFEN 800 MG PO TABS: 800.0000 mg | ORAL_TABLET | Freq: Three times a day (TID) | ORAL | 0 refills | Status: DC

## 2021-11-02 MED ORDER — TAMSULOSIN HCL 0.4 MG PO CAPS: 0.4000 mg | ORAL_CAPSULE | Freq: Every day | ORAL | 0 refills | Status: AC

## 2021-11-02 MED ORDER — ONDANSETRON 8 MG PO TBDP: 8.0000 mg | ORAL_TABLET | Freq: Three times a day (TID) | ORAL | 0 refills | Status: AC | PRN

## 2021-11-02 MED ORDER — SODIUM CHLORIDE 0.9 % IV BOLUS
1000.0000 mL | Freq: Once | INTRAVENOUS | Status: AC
  Administered 2021-11-02: 1000 mL via INTRAVENOUS

## 2021-11-02 NOTE — ED Provider Notes (Signed)
[facility] EMERGENCY DEPARTMENT Provider Note   CSN: [id number] Arrival date & time: 11/02/21  1825     History  Chief Complaint  Patient presents with   Abdominal Pain    [name] is a 46 y.o. female.   Abdominal Pain  Patient with no current medical problems presents due to left-sided flank pain.  The started this morning, progressively worsening throughout the day and is associate with nausea but no vomiting.  States she is also having lower abdominal pain that feels like cramps, her last menstrual cycle was 1 week ago.  The left-sided flank pain comes and goes, she is unable to find a comfortable position and cannot identify any provoking or improving features.  Denies any dysuria or hematuria.  No previous abdominal surgeries.  Home Medications Prior to Admission medications   Medication Sig Start Date End Date Taking? Authorizing Provider  ibuprofen (ADVIL) 800 MG tablet Take 1 tablet (800 mg total) by mouth 3 (three) times daily. 11/02/21   Sherrill Raring, PA-C  ondansetron (ZOFRAN-ODT) 8 MG disintegrating tablet Take 1 tablet (8 mg total) by mouth every 8 (eight) hours as needed. 11/02/21   Sherrill Raring, PA-C  tamsulosin (FLOMAX) 0.4 MG CAPS capsule Take 1 capsule (0.4 mg total) by mouth daily after supper. 11/02/21   Sherrill Raring, PA-C  traMADol (ULTRAM) 50 MG tablet Take 1 tablet (50 mg total) by mouth every 6 (six) hours as needed. 11/02/21   Sherrill Raring, PA-C      Allergies    Hydrocodone, Penicillins, and Tylox [oxycodone-acetaminophen]    Review of Systems   Review of Systems  Gastrointestinal:  Positive for abdominal pain.   Physical Exam Updated Vital Signs BP 124/81 (BP Location: Left Arm)    Pulse (!) 53    Temp 97.6 F (36.4 C) (Oral)    Resp 16    Ht 5' 2.5" (1.588 m)    Wt 59.4 kg    LMP 10/19/2021    SpO2 100%    BMI 23.58 kg/m  Physical Exam Vitals and nursing note reviewed. Exam conducted with a chaperone present.  Constitutional:       Appearance: Normal appearance.  HENT:     Head: Normocephalic and atraumatic.  Eyes:     General: No scleral icterus.       Right eye: No discharge.        Left eye: No discharge.     Extraocular Movements: Extraocular movements intact.     Pupils: Pupils are equal, round, and reactive to light.  Cardiovascular:     Rate and Rhythm: Normal rate and regular rhythm.     Pulses: Normal pulses.     Heart sounds: Normal heart sounds. No murmur heard.   No friction rub. No gallop.  Pulmonary:     Effort: Pulmonary effort is normal. No respiratory distress.     Breath sounds: Normal breath sounds.  Abdominal:     General: Abdomen is flat. Bowel sounds are normal. There is no distension.     Palpations: Abdomen is soft.     Tenderness: There is no abdominal tenderness. There is left CVA tenderness.  Skin:    General: Skin is warm and dry.     Coloration: Skin is not jaundiced.  Neurological:     Mental Status: She is alert. Mental status is at baseline.     Coordination: Coordination normal.    ED Results / Procedures / Treatments   Labs (all labs ordered  are listed, but only abnormal results are displayed) Labs Reviewed  URINALYSIS, ROUTINE W REFLEX MICROSCOPIC - Abnormal; Notable for the following components:      Result Value   Hgb urine dipstick LARGE (*)    All other components within normal limits  COMPREHENSIVE METABOLIC PANEL - Abnormal; Notable for the following components:   Glucose, Bld 144 (*)    All other components within normal limits  URINALYSIS, MICROSCOPIC (REFLEX) - Abnormal; Notable for the following components:   Bacteria, UA FEW (*)    All other components within normal limits  PREGNANCY, URINE  CBC WITH DIFFERENTIAL/PLATELET  LIPASE, BLOOD    EKG None  Radiology CT Renal Stone Study  Result Date: 11/02/2021 CLINICAL DATA:  Flank pain. EXAM: CT ABDOMEN AND PELVIS WITHOUT CONTRAST TECHNIQUE: Multidetector CT imaging of the abdomen and pelvis was  performed following the standard protocol without IV contrast. RADIATION DOSE REDUCTION: This exam was performed according to the departmental dose-optimization program which includes automated exposure control, adjustment of the mA and/or kV according to patient size and/or use of iterative reconstruction technique. COMPARISON:  None. FINDINGS: Lower chest: No acute abnormality. Hepatobiliary: No focal liver abnormality is seen. No gallstones, gallbladder wall thickening, or biliary dilatation. Pancreas: Unremarkable. No pancreatic ductal dilatation or surrounding inflammatory changes. Spleen: Normal in size without focal abnormality. Adrenals/Urinary Tract: LEFT hydronephrosis, at least moderate in degree. 3 mm stone within the proximal LEFT ureter. No additional ureteral stone identified. No bladder stone seen. Bladder is unremarkable, decompressed. RIGHT kidney appears normal without stone or hydronephrosis. Stomach/Bowel: No distended large or small bowel loops. No evidence of bowel wall inflammation. Appendix is normal. Stomach is unremarkable. Vascular/Lymphatic: No significant vascular findings are present. No enlarged abdominal or pelvic lymph nodes. Reproductive: Uterus and bilateral adnexa are unremarkable. Other: No free fluid or abscess collection is seen. No free intraperitoneal air. Musculoskeletal: Osseous structures are unremarkable. Small umbilical abdominal wall hernia which contains fat only. IMPRESSION: 1. 3 mm stone within the proximal LEFT ureter, causing moderate LEFT hydronephrosis. 2. Small umbilical abdominal wall hernia which contains fat only. Electronically Signed   By: Franki Cabot M.D.   On: 11/02/2021 20:33    Procedures Procedures    Medications Ordered in ED Medications  sodium chloride 0.9 % bolus 1,000 mL ( Intravenous Stopped 11/02/21 1904)  ondansetron (ZOFRAN) injection 4 mg (4 mg Intravenous Given 11/02/21 1859)  fentaNYL (SUBLIMAZE) injection 50 mcg (50 mcg  Intravenous Given 11/02/21 1859)  ketorolac (TORADOL) 30 MG/ML injection 30 mg (30 mg Intravenous Given 11/02/21 2203)    ED Course/ Medical Decision Making/ A&P                           Medical Decision Making Amount and/or Complexity of Data Reviewed Labs: ordered. Radiology: ordered.  Risk Prescription drug management.   Patient presenting with left flank pain.  Differential diagnosis includes but is not limited to nephrolithiasis, septic stone, ovarian torsion, pyelonephritis, UTI  Patient spouse is at bedside providing additional history.  Left CVA tenderness, lower abdominal tenderness without any real rigidity or guarding.  Does not appear peritoneal.  I ordered patient fluid and pain medicine.  I reviewed patient's medical history and she does have documented hives allergy with hydrocodone so we will proceed with fentanyl.   Laboratory work-up was ordered by myself and independently interpreted.  No leukocytosis or electrolyte derangement.  Additionally there is no AKI or signs of infection in the  urine.  No evidence of UTI in the urine either.    I reviewed the CT renal study and agree with radiologist interpretation for notable for 3 mm stone and moderate hydronephrosis.    On reevaluation patient reports improvement of her pain.  I viewed cardiac monitor and she is normal sinus rhythm with a heart rate of 58.  Tolerating p.o.  No evidence of septic stone or infectious etiology.  No significant obstruction or impairment to renal function suggested on lab work-up.  Tolerating p.o. and pain has improved.  Given these reasons I feel patient is appropriate for outpatient management.  Will discharge with pain medicine, nausea medicine and Flomax.        Final Clinical Impression(s) / ED Diagnoses Final diagnoses:  Nephrolithiasis    Rx / DC Orders ED Discharge Orders          Ordered    tamsulosin (FLOMAX) 0.4 MG CAPS capsule  Daily after supper,   Status:   Discontinued        11/02/21 2155    ondansetron (ZOFRAN-ODT) 8 MG disintegrating tablet  Every 8 hours PRN,   Status:  Discontinued        11/02/21 2155    ibuprofen (ADVIL) 800 MG tablet  3 times daily,   Status:  Discontinued        11/02/21 2155    traMADol (ULTRAM) 50 MG tablet  Every 6 hours PRN,   Status:  Discontinued        11/02/21 2155    ibuprofen (ADVIL) 800 MG tablet  3 times daily,   Status:  Discontinued        11/02/21 2224    ondansetron (ZOFRAN-ODT) 8 MG disintegrating tablet  Every 8 hours PRN,   Status:  Discontinued        11/02/21 2224    tamsulosin (FLOMAX) 0.4 MG CAPS capsule  Daily after supper,   Status:  Discontinued        11/02/21 2224    traMADol (ULTRAM) 50 MG tablet  Every 6 hours PRN,   Status:  Discontinued        11/02/21 2224    ibuprofen (ADVIL) 800 MG tablet  3 times daily        11/02/21 2228    ondansetron (ZOFRAN-ODT) 8 MG disintegrating tablet  Every 8 hours PRN        11/02/21 2228    tamsulosin (FLOMAX) 0.4 MG CAPS capsule  Daily after supper        11/02/21 2228    traMADol (ULTRAM) 50 MG tablet  Every 6 hours PRN        11/02/21 2228              Sherrill Raring, PA-C 11/02/21 2333    Truddie Hidden, MD 11/03/21 [phone]

## 2021-11-02 NOTE — ED Triage Notes (Signed)
Left flank pain this am that has progressively gotten worse.

## 2021-11-02 NOTE — Discharge Instructions (Addendum)
You have a left sided kidney stone.  This should pass on its own, try to take ibuprofen 800 mg every 8 hours to stay ahead of the pain.  I have prescribed you tramadol. Take that for breakthrough pain.   Take it with the nausea medicine, you can take the nausea medicine 5 minutes before the tramadol.  Take Flomax in the evening with dinner.  Schedule follow-up with urology for reevaluation the next week or so.  Stone should pass between days to a week.  If you are unable to take anything by mouth without vomiting or the pain becomes too severe to handle at home he can return back to the ED.

## 2022-10-23 LAB — EXTERNAL GENERIC LAB PROCEDURE: COLOGUARD: NEGATIVE

## 2022-10-23 LAB — COLOGUARD: COLOGUARD: NEGATIVE

## 2022-12-07 IMAGING — CT CT RENAL STONE PROTOCOL
2 of 4 series · 17 of 46 positions shown, 19 images · non-contrast
Comparison: None.

CLINICAL DATA: Flank pain.



[Series 2: axial st · axial · 0.78mm/px · z∈[-628,-244]mm · 14 of 85 slices shown, 16 images]
[im 4/85  soft-tissue]
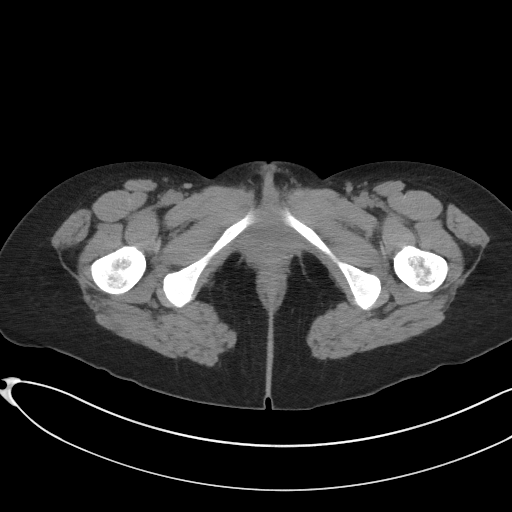
[im 4/85  bone]
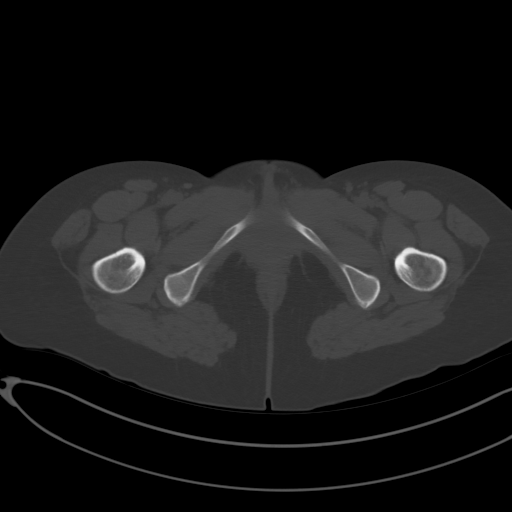
[im 11/85  soft-tissue]
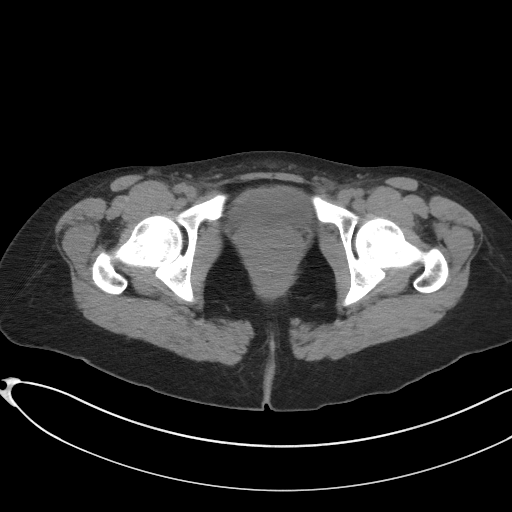
[im 17/85  soft-tissue]
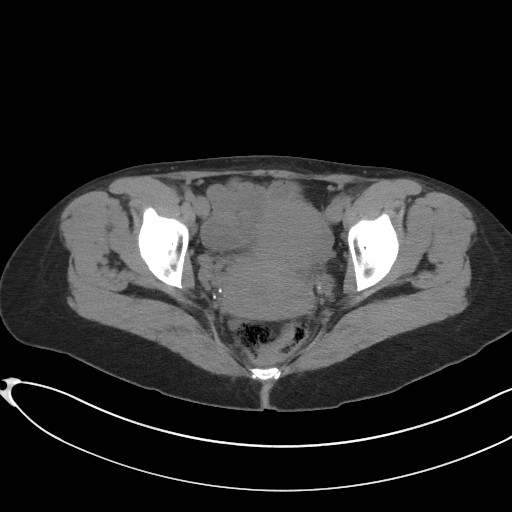
[im 24/85  soft-tissue]
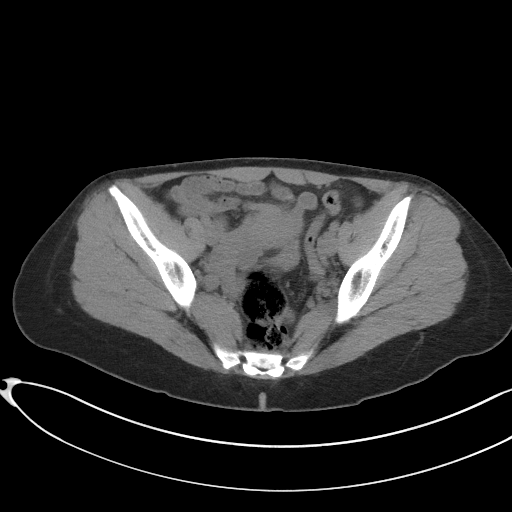
[im 27/85  soft-tissue]
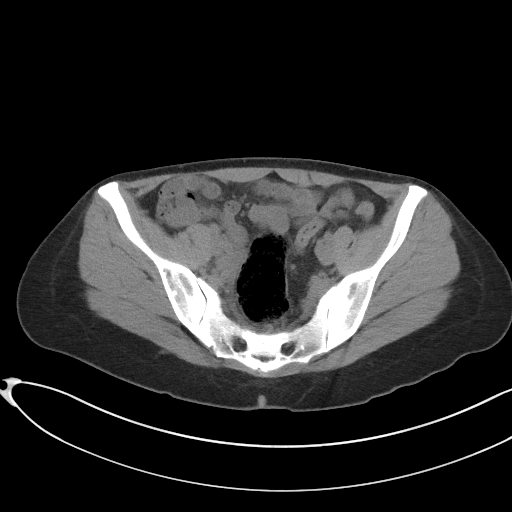
[im 34/85  soft-tissue]
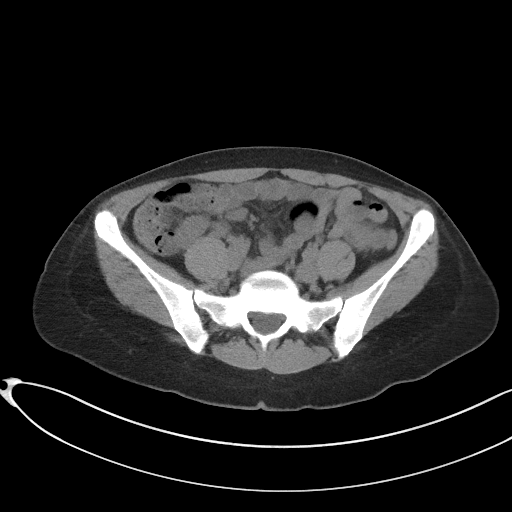
[im 41/85  soft-tissue]
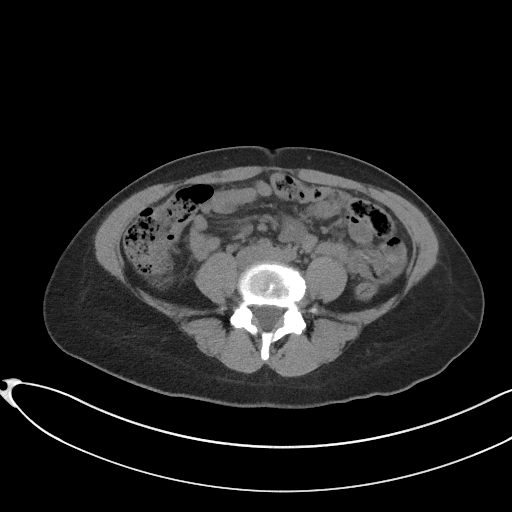
[im 44/85  soft-tissue]
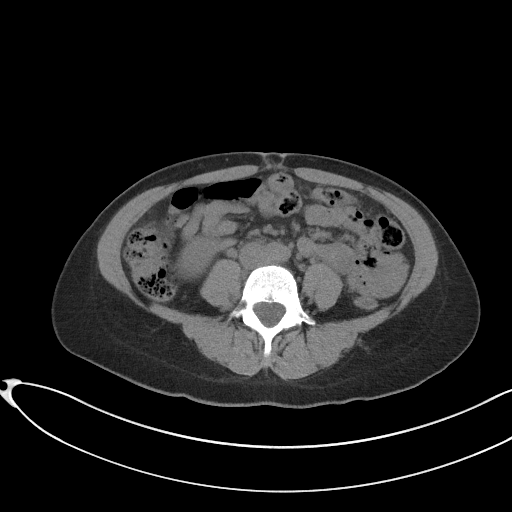
[im 51/85  soft-tissue]
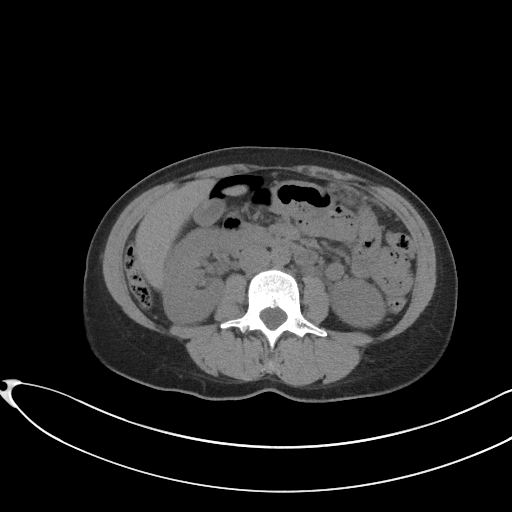
[im 51/85  bone]
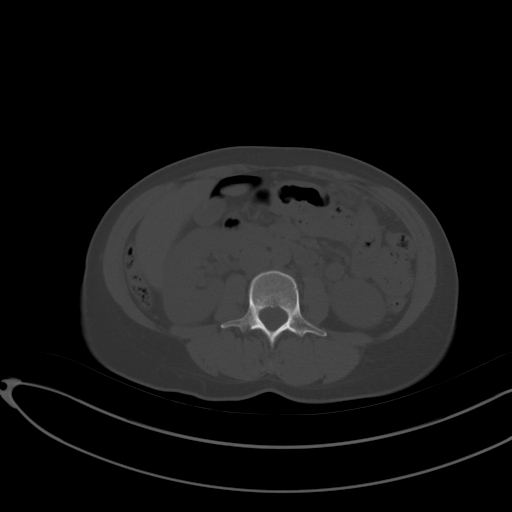
[im 58/85  soft-tissue]
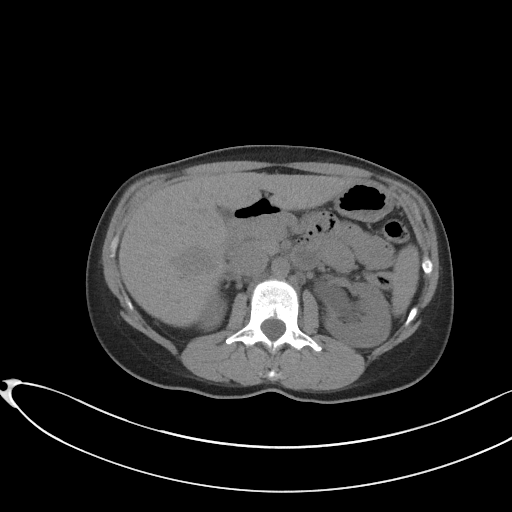
[im 64/85  soft-tissue]
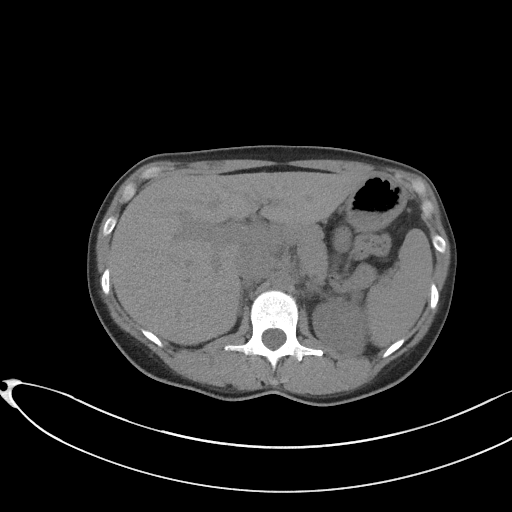
[im 68/85  soft-tissue]
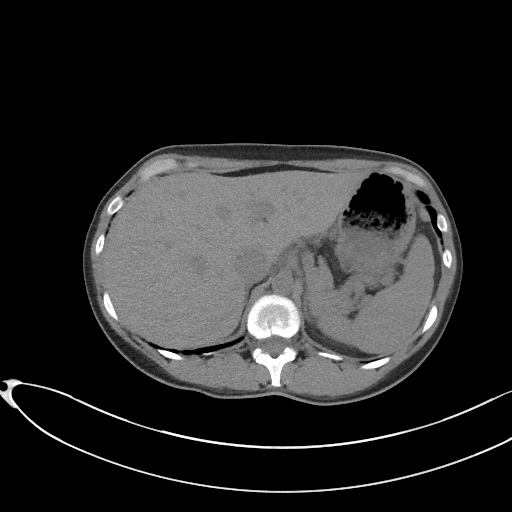
[im 74/85  soft-tissue]
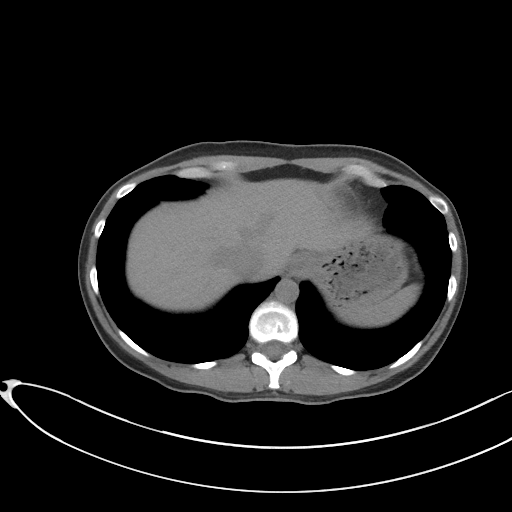
[im 81/85  soft-tissue]
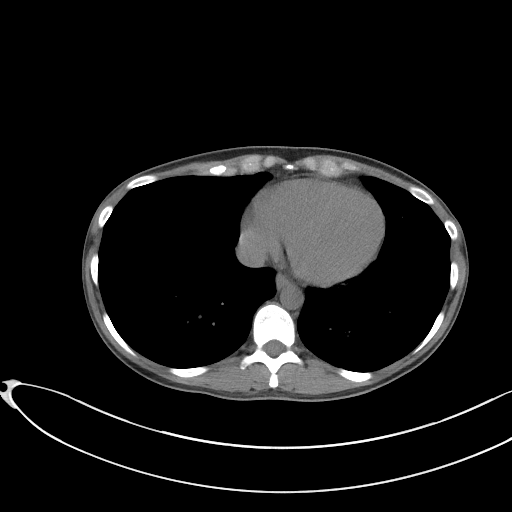

[Series 4: coronal st · coronal · 0.84mm/px · 3 of 99 slices shown]
[im 33/99  soft-tissue]
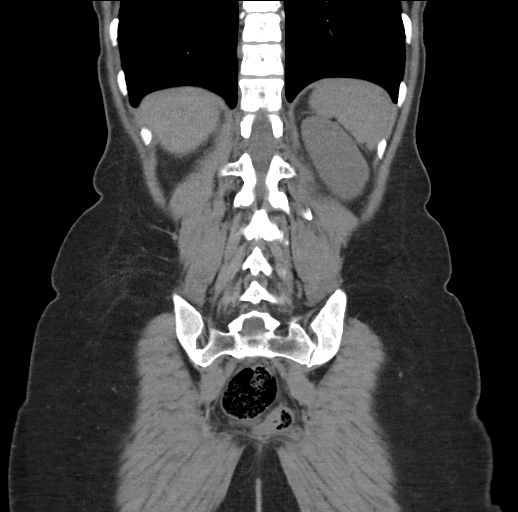
[im 44/99  soft-tissue]
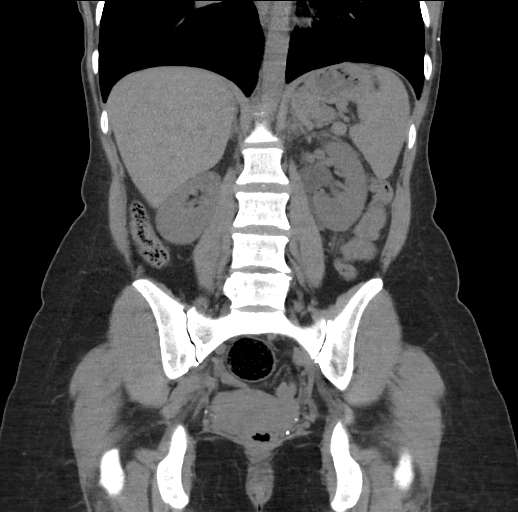
[im 55/99  soft-tissue]
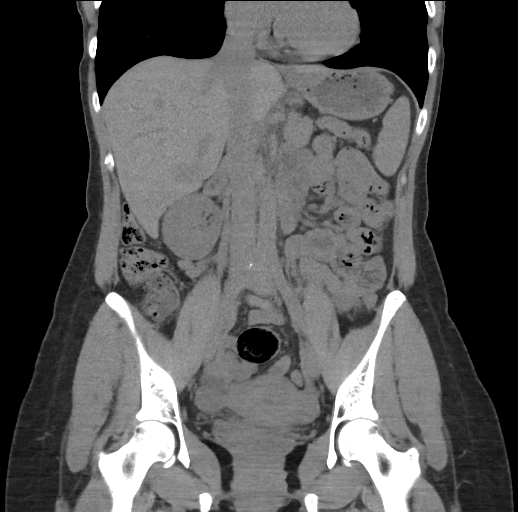

[17 of 46 positions shown; findings below may reference images not displayed]

FINDINGS: Lower chest: No acute abnormality.

Hepatobiliary: No focal liver abnormality is seen. No gallstones,
gallbladder wall thickening, or biliary dilatation.

Pancreas: Unremarkable. No pancreatic ductal dilatation or
surrounding inflammatory changes.

Spleen: Normal in size without focal abnormality.

Adrenals/Urinary Tract: LEFT hydronephrosis, at least moderate in
degree. 3 mm stone within the proximal LEFT ureter. No additional
ureteral stone identified. No bladder stone seen. Bladder is
unremarkable, decompressed. RIGHT kidney appears normal without
stone or hydronephrosis.

Stomach/Bowel: No distended large or small bowel loops. No evidence
of bowel wall inflammation. Appendix is normal. Stomach is
unremarkable.

Vascular/Lymphatic: No significant vascular findings are present. No
enlarged abdominal or pelvic lymph nodes.

Reproductive: Uterus and bilateral adnexa are unremarkable.

Other: No free fluid or abscess collection is seen. No free
intraperitoneal air.

Musculoskeletal: Osseous structures are unremarkable. Small
umbilical abdominal wall hernia which contains fat only.
IMPRESSION: 1. 3 mm stone within the proximal LEFT ureter, causing moderate LEFT
hydronephrosis.
2. Small umbilical abdominal wall hernia which contains fat only.

## 2023-09-19 ENCOUNTER — Other Ambulatory Visit: Payer: Self-pay | Admitting: Obstetrics and Gynecology

## 2023-09-19 DIAGNOSIS — Z1231 Encounter for screening mammogram for malignant neoplasm of breast: Secondary | ICD-10-CM

## 2023-10-19 ENCOUNTER — Ambulatory Visit: Payer: BC Managed Care – PPO

## 2023-10-21 ENCOUNTER — Ambulatory Visit
Admission: RE | Admit: 2023-10-21 | Discharge: 2023-10-21 | Disposition: A | Discharge status: Home / Self Care | Payer: BC Managed Care – PPO | Source: Ambulatory Visit | Attending: Obstetrics and Gynecology | Admitting: Obstetrics and Gynecology

## 2023-10-21 DIAGNOSIS — Z1231 Encounter for screening mammogram for malignant neoplasm of breast: Secondary | ICD-10-CM

## 2024-07-27 ENCOUNTER — Other Ambulatory Visit: Payer: Self-pay | Admitting: Obstetrics and Gynecology

## 2024-07-27 DIAGNOSIS — Z1231 Encounter for screening mammogram for malignant neoplasm of breast: Secondary | ICD-10-CM

## 2024-10-25 ENCOUNTER — Ambulatory Visit
